# Patient Record
Sex: Male | Born: 1995 | Race: White | Hispanic: No | Marital: Single | Smoking: Never smoker
Health system: Southern US, Community
[De-identification: ages and names within clinical notes are randomized; demographics above are authoritative.]

## PROBLEM LIST (undated history)

## (undated) DIAGNOSIS — S83511A Sprain of anterior cruciate ligament of right knee, initial encounter: Secondary | ICD-10-CM

## (undated) HISTORY — PX: WISDOM TOOTH EXTRACTION: SHX21

---
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
## 2014-03-06 IMAGING — CR DG CHEST 2V
2 series · 2 of 2 positions shown · non-contrast
Comparison: None.

CLINICAL DATA: Chest pain; cough and fever

CHEST - 2 VIEW

[w chest lat]
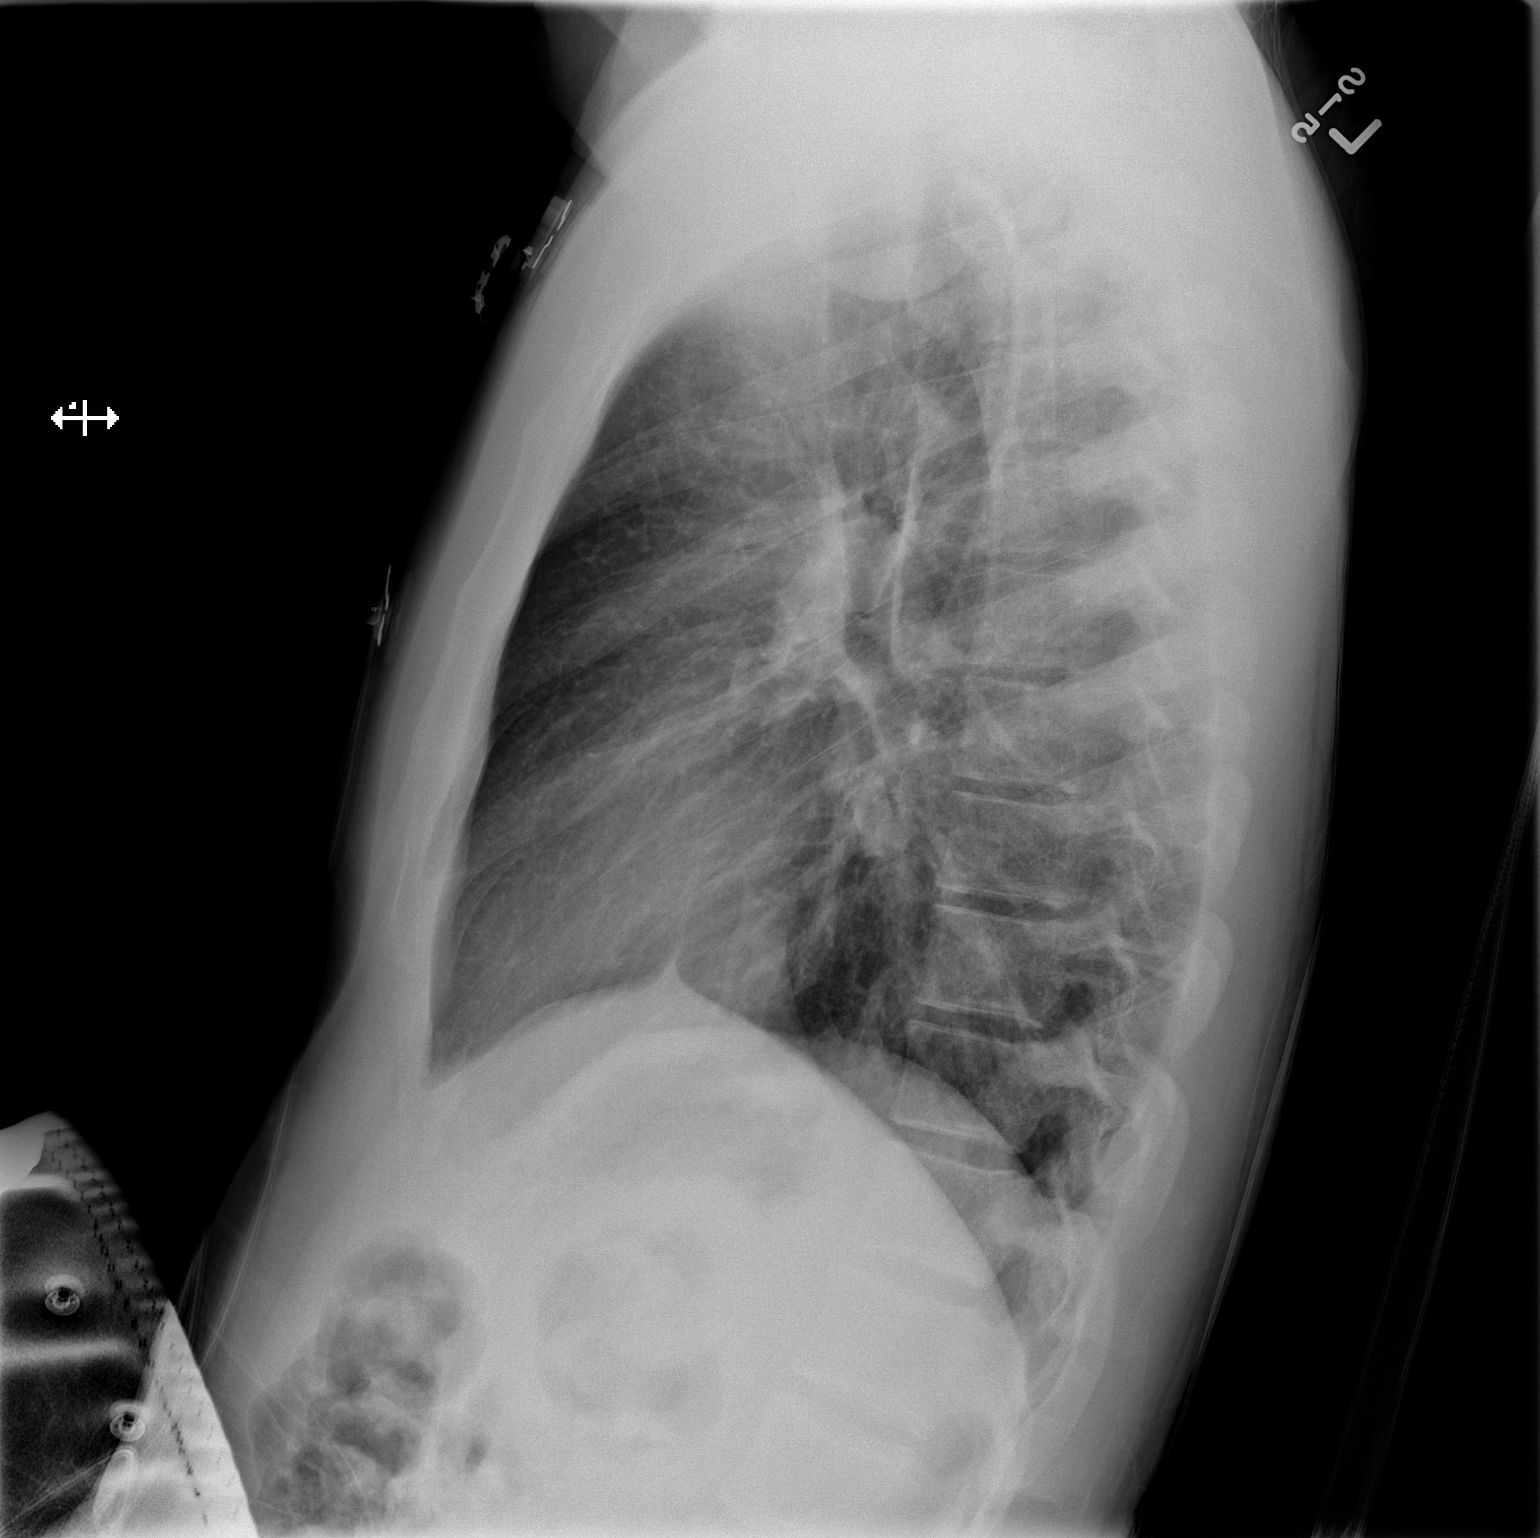

[w chest pa]
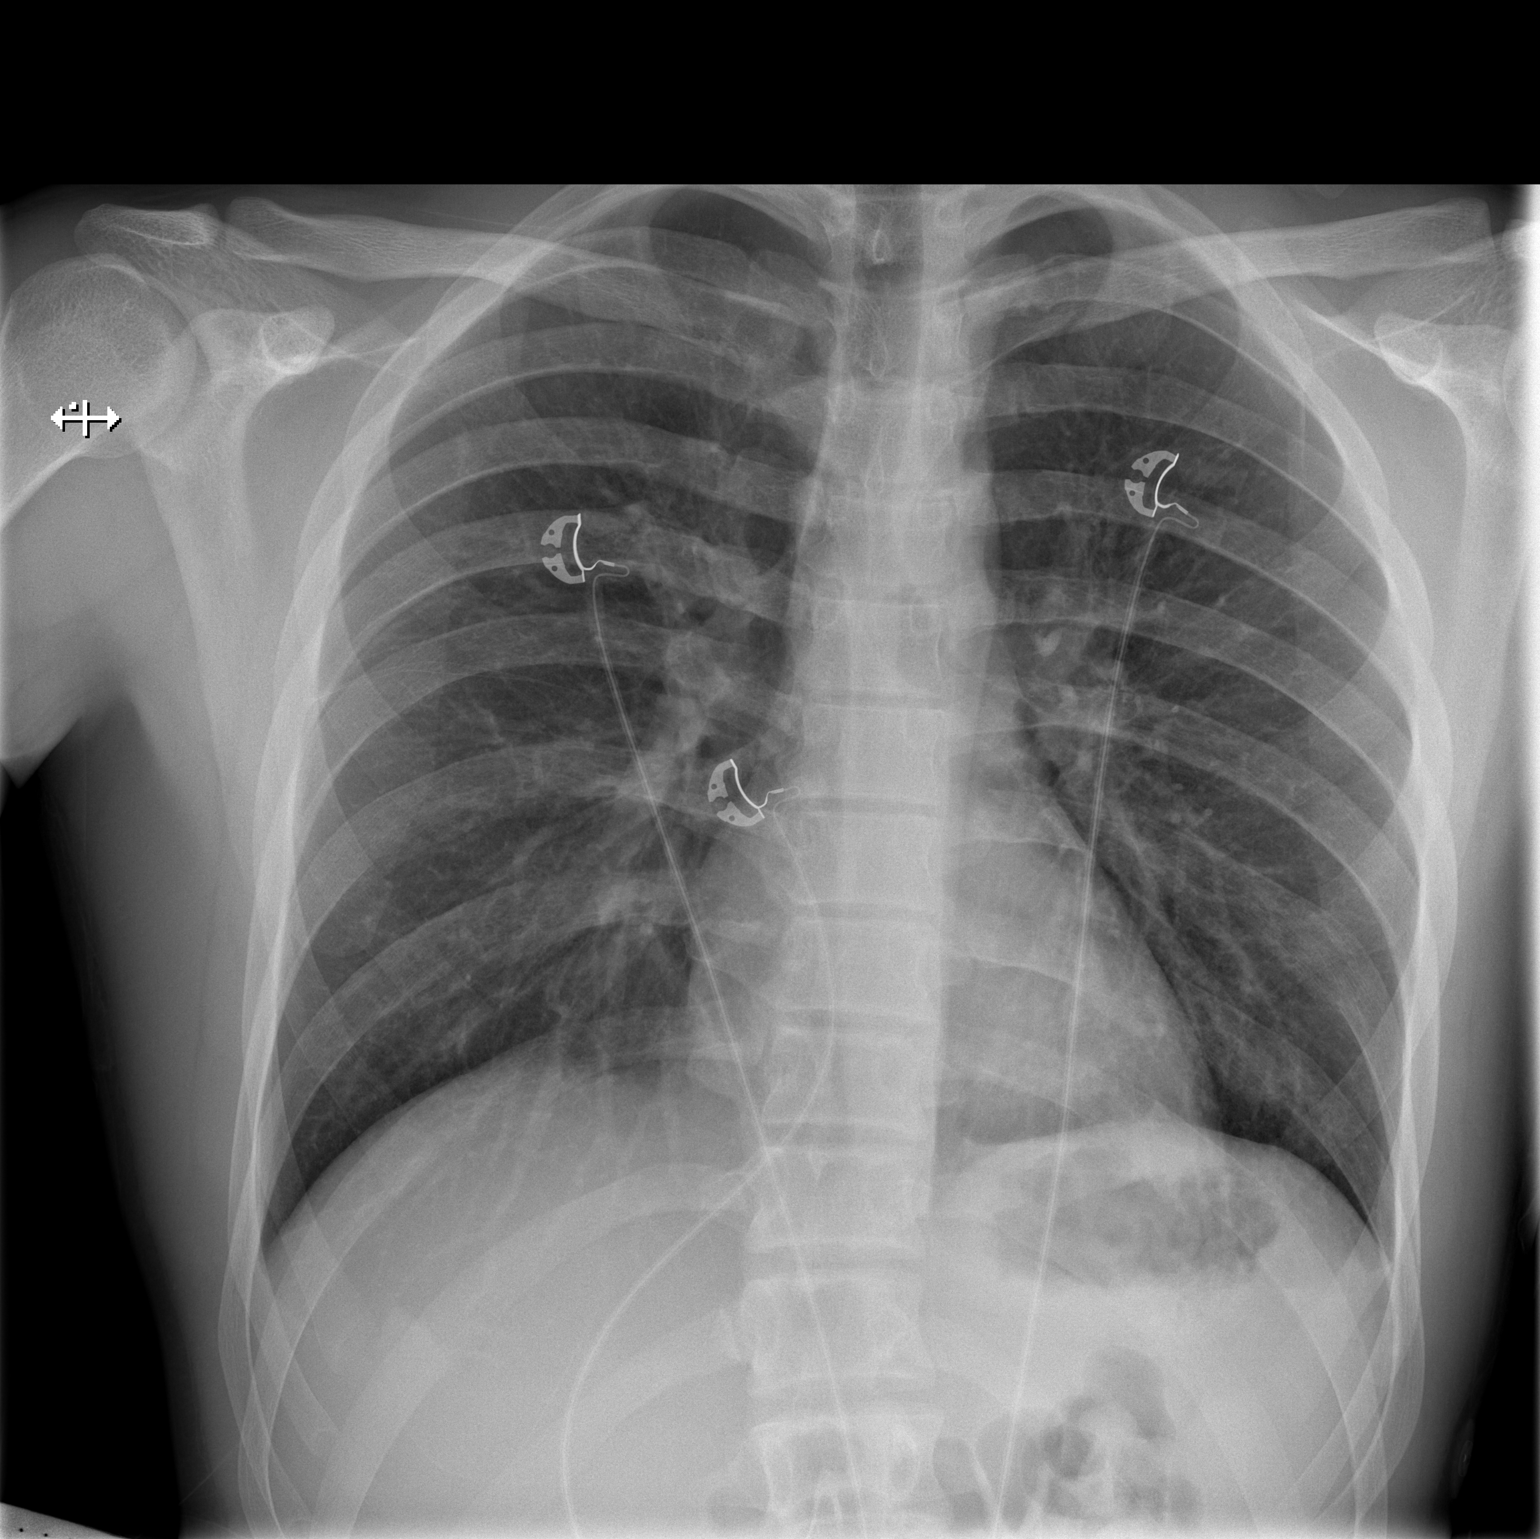

[2 of 2 positions shown; findings below may reference images not displayed]

FINDINGS: Lungs clear.  Heart size and pulmonary vascularity are
normal.  No adenopathy.  No pneumothorax.  No bone lesions.
IMPRESSION: No abnormality noted.

## 2014-03-06 IMAGING — CT CT HEAD W/O CM
1 of 2 series · 13 of 30 positions shown, 17 images · non-contrast
Comparison: None

CLINICAL DATA: Loss of consciousness.  Emesis and fever.

CT HEAD WITHOUT CONTRAST
TECHNIQUE: Contiguous axial images were obtained from the base of
the skull through the vertex without contrast.

[Series 2: peds brain wo · axial · 0.51mm/px · z∈[+147,+274]mm · 13 of 61 slices shown, 17 images]
[im 5/61  brain]
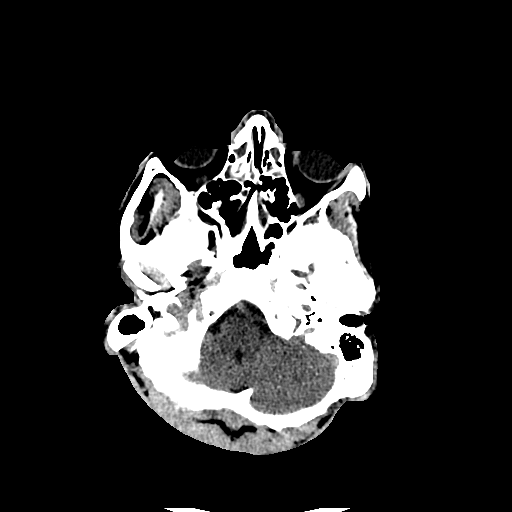
[im 5/61  bone]
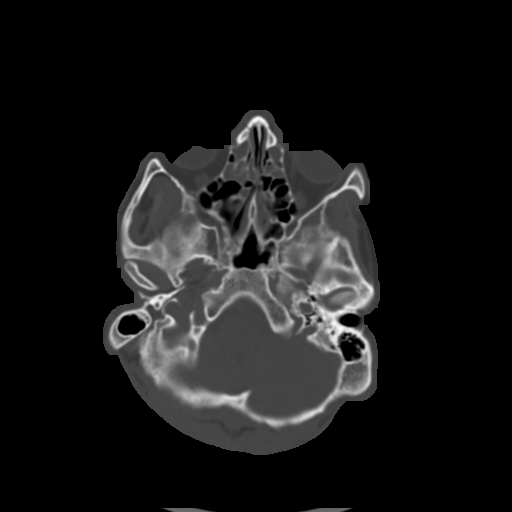
[im 9/61  brain]
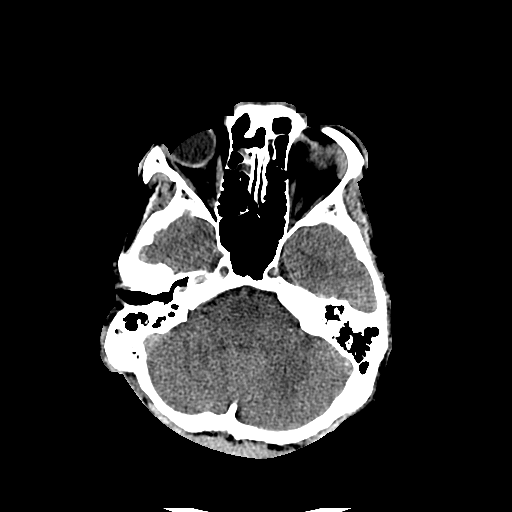
[im 13/61  brain]
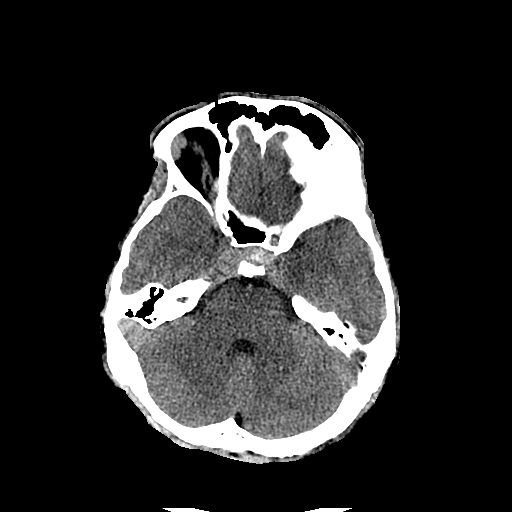
[im 18/61  brain]
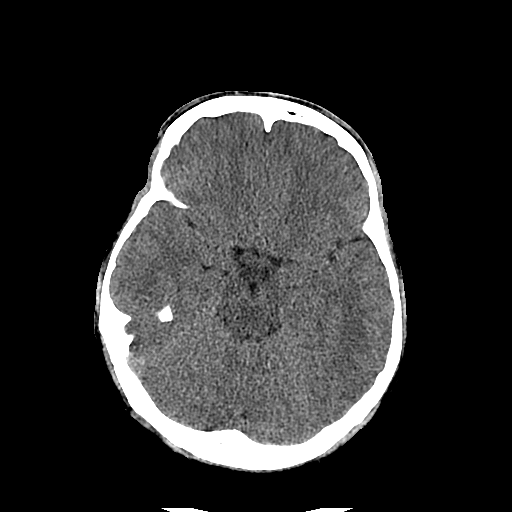
[im 22/61  brain]
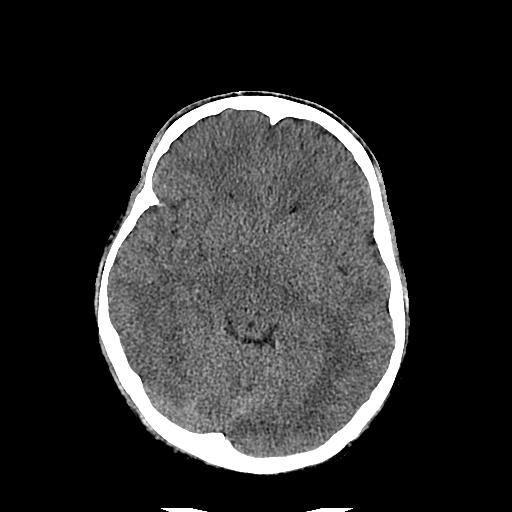
[im 22/61  bone]
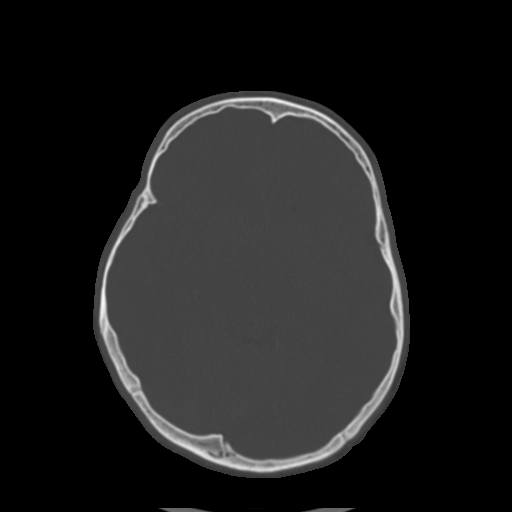
[im 26/61  brain]
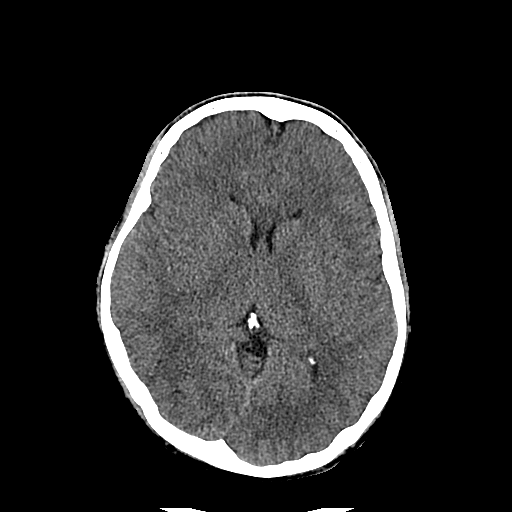
[im 31/61  brain]
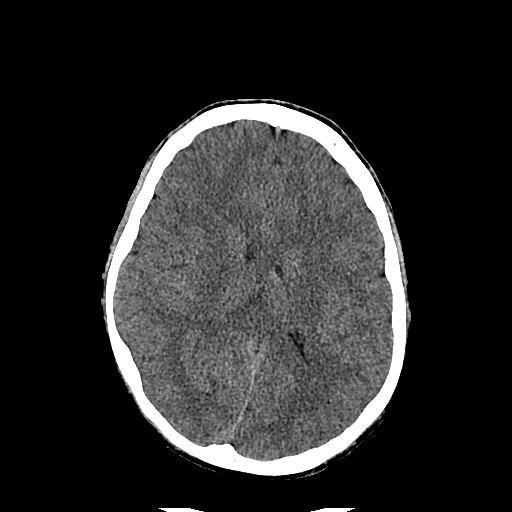
[im 35/61  brain]
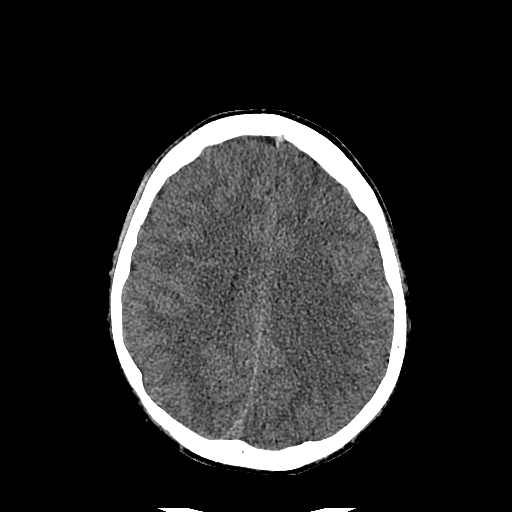
[im 39/61  brain]
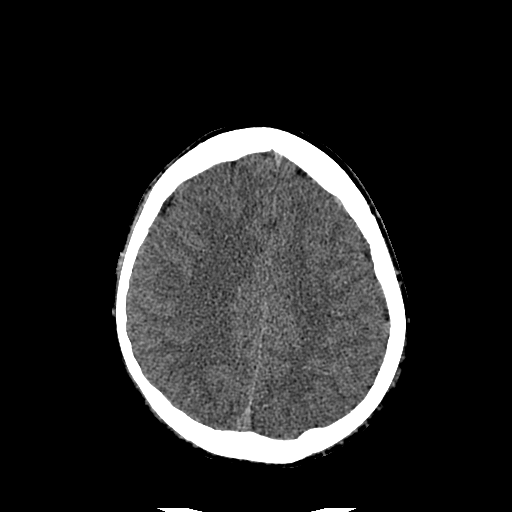
[im 39/61  bone]
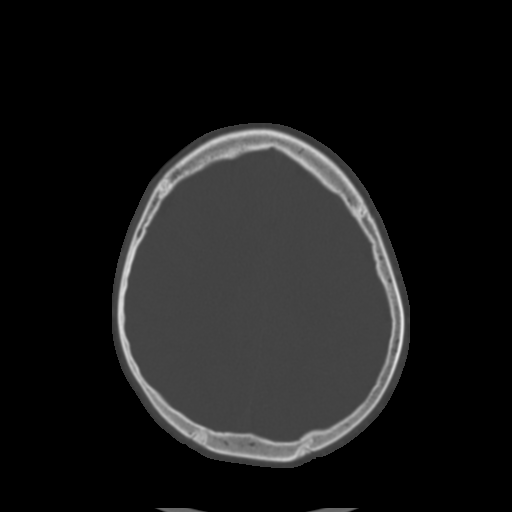
[im 43/61  brain]
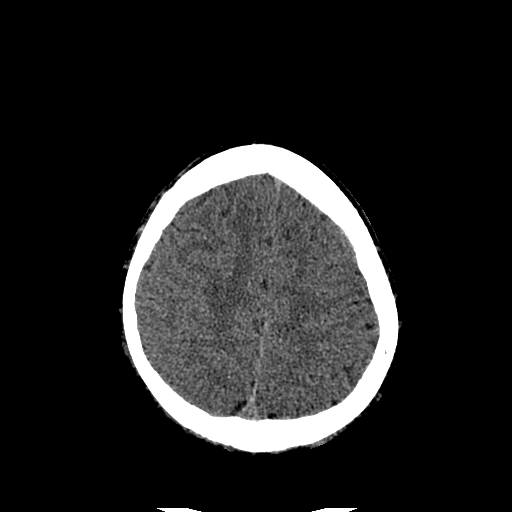
[im 48/61  brain]
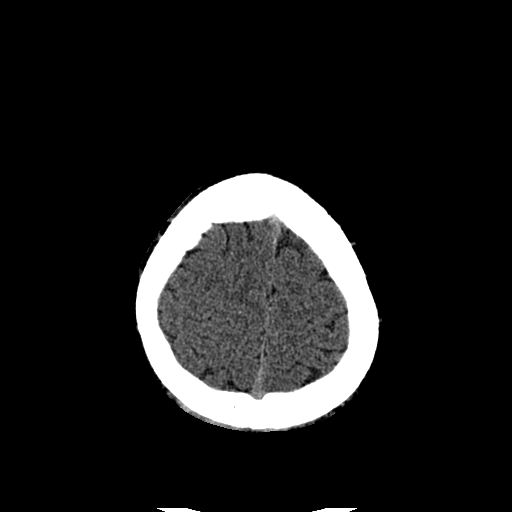
[im 52/61  brain]
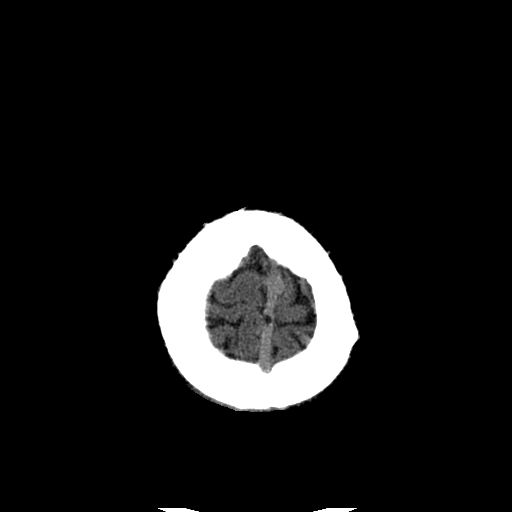
[im 56/61  brain]
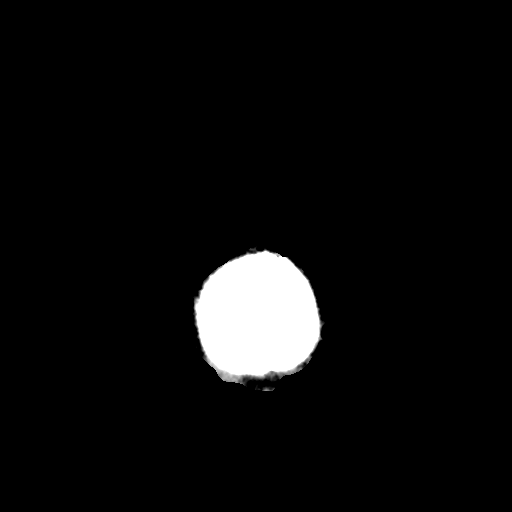
[im 56/61  bone]
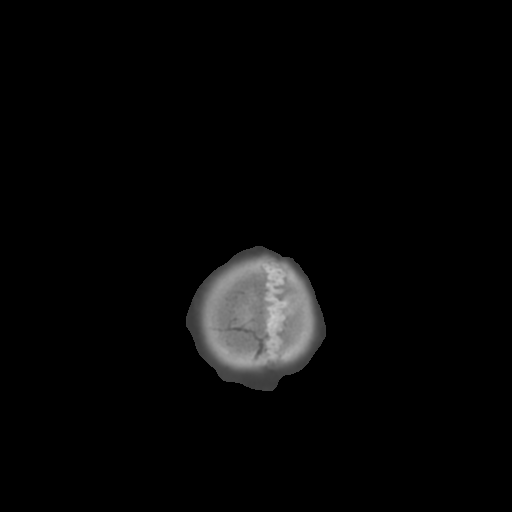

[13 of 30 positions shown; findings below may reference images not displayed]

FINDINGS: The brain has a normal appearance without evidence for
hemorrhage, infarction, hydrocephalus, or mass lesion.  There is no
extra axial fluid collection.  There is a fluid level within the
left maxillary sinus.  Partial opacification of the ethmoid air
cells noted.  The mastoid air cells are clear.  The skull is
intact.
IMPRESSION: 1.  No acute intracranial abnormalities.
2.  Sinus opacification.

## 2016-07-16 DIAGNOSIS — S83511A Sprain of anterior cruciate ligament of right knee, initial encounter: Secondary | ICD-10-CM

## 2016-07-16 HISTORY — DX: Sprain of anterior cruciate ligament of right knee, initial encounter: S83.511A

## 2016-07-27 ENCOUNTER — Encounter (HOSPITAL_BASED_OUTPATIENT_CLINIC_OR_DEPARTMENT_OTHER): Payer: Self-pay | Admitting: *Deleted

## 2016-07-29 ENCOUNTER — Ambulatory Visit: Payer: Self-pay | Admitting: Physician Assistant

## 2016-07-29 NOTE — H&P (Signed)
Riley Gaines is an 21 y.o. male.   Chief Complaint: right knee pain HPI: Patient is a 21 year-old male who is very active.  He is a IT sales professionalfirefighter.  He states he was wakeboarding about two weeks ago.  It was his first pass of the day when he came down into the water and the knee caught or twisted and he had immediate pain.  Unable to continue with the watersports.  He said he felt a pop and had a significant effusion and swelling following this.  Difficulty walking on it for a couple of days.  The pain did start to let up some.  He returned to work and was doing some training exercises last Friday, stepping out of a window and again felt a pop with a lateral movement from the knee.  Here for orthopedic evaluation. MRI confirms ACL tear.  Past Medical History:  Diagnosis Date  . Right ACL tear 07/2016    Past Surgical History:  Procedure Laterality Date  . WISDOM TOOTH EXTRACTION      No family history on file. Social History:  reports that he has never smoked. He has never used smokeless tobacco. He reports that he drinks alcohol. He reports that he does not use drugs.  Allergies: No Known Allergies   (Not in a hospital admission)  No results found for this or any previous visit (from the past 48 hour(s)). No results found.  Review of Systems  Musculoskeletal: Positive for falls and joint pain.  All other systems reviewed and are negative.   There were no vitals taken for this visit. Physical Exam  Constitutional: He is oriented to person, place, and time. He appears well-developed and well-nourished. No distress.  HENT:  Head: Normocephalic and atraumatic.  Eyes: Conjunctivae and EOM are normal. Pupils are equal, round, and reactive to light.  Neck: Normal range of motion. Neck supple.  Cardiovascular: Normal rate and intact distal pulses.   Respiratory: Effort normal. No respiratory distress.  GI: Soft. He exhibits no distension. There is no tenderness.  Musculoskeletal:   Right knee: He exhibits decreased range of motion, swelling and effusion.  Positive lachman   Neurological: He is alert and oriented to person, place, and time.  Skin: Skin is warm and dry. No erythema.  Psychiatric: He has a normal mood and affect. His behavior is normal.     Assessment/Plan Right knee ACL tear  MRI confirms isolated ACL tear without other internal derangement.  He had this happen with initial trauma at the beach.  He did have a second event at work, but based on the history I think it is highly likely that he tore his ACL with the initial event.  Risks and benefits of ACL reconstruction are discussed with the patient.  Proceed with potential scheduling as soon as practical based on wanting to get it done before I leave for vacation.  Alternatively, he is going on vacation in July.  He could be a light duty this point forward with office work only.  I told him it would not affect his results to wait until after his vacation if he wanted to.  Obviously if he went to the beach several weeks after surgery he would not be able to get in the water, etc.  I think that part is more of a social decision.  He is given a prescription for Percocet and Robaxin.  He would need post-op PT here as well and post-op visit depending on when he  decides to do it.  I look forward to potentially scheduling it in the foreseeable future.    Margart Sickles, PA-C 07/29/2016, 12:33 PM

## 2016-07-30 ENCOUNTER — Ambulatory Visit (HOSPITAL_BASED_OUTPATIENT_CLINIC_OR_DEPARTMENT_OTHER): Payer: Worker's Compensation | Admitting: Anesthesiology

## 2016-07-30 ENCOUNTER — Ambulatory Visit (HOSPITAL_BASED_OUTPATIENT_CLINIC_OR_DEPARTMENT_OTHER)
Admission: RE | Admit: 2016-07-30 | Discharge: 2016-07-30 | Disposition: A | Discharge status: Home / Self Care | Payer: Worker's Compensation | Source: Ambulatory Visit | Attending: Orthopedic Surgery | Admitting: Orthopedic Surgery

## 2016-07-30 ENCOUNTER — Encounter (HOSPITAL_BASED_OUTPATIENT_CLINIC_OR_DEPARTMENT_OTHER)
Admission: RE | Disposition: A | Discharge status: Home / Self Care | Payer: Self-pay | Source: Ambulatory Visit | Attending: Orthopedic Surgery

## 2016-07-30 ENCOUNTER — Encounter (HOSPITAL_BASED_OUTPATIENT_CLINIC_OR_DEPARTMENT_OTHER): Payer: Self-pay | Admitting: *Deleted

## 2016-07-30 DIAGNOSIS — Y92828 Other wilderness area as the place of occurrence of the external cause: Secondary | ICD-10-CM | POA: Insufficient documentation

## 2016-07-30 DIAGNOSIS — S83511A Sprain of anterior cruciate ligament of right knee, initial encounter: Secondary | ICD-10-CM | POA: Insufficient documentation

## 2016-07-30 DIAGNOSIS — Y9317 Activity, water skiing and wake boarding: Secondary | ICD-10-CM | POA: Insufficient documentation

## 2016-07-30 HISTORY — DX: Sprain of anterior cruciate ligament of right knee, initial encounter: S83.511A

## 2016-07-30 HISTORY — PX: KNEE ARTHROSCOPY WITH ANTERIOR CRUCIATE LIGAMENT (ACL) REPAIR: SHX5644

## 2016-07-30 SURGERY — KNEE ARTHROSCOPY WITH ANTERIOR CRUCIATE LIGAMENT (ACL) REPAIR
Anesthesia: General | Site: Knee | Laterality: Right

## 2016-07-30 MED ORDER — FENTANYL CITRATE (PF) 100 MCG/2ML IJ SOLN
INTRAMUSCULAR | Status: AC
  Filled 2016-07-30: qty 2

## 2016-07-30 MED ORDER — MIDAZOLAM HCL 2 MG/2ML IJ SOLN
INTRAMUSCULAR | Status: AC
  Filled 2016-07-30: qty 2

## 2016-07-30 MED ORDER — PROMETHAZINE HCL 25 MG/ML IJ SOLN: 6.2500 mg | INTRAMUSCULAR | Status: DC | PRN

## 2016-07-30 MED ORDER — MORPHINE SULFATE 10 MG/ML IJ SOLN
INTRAMUSCULAR | Status: DC | PRN
  Administered 2016-07-30 (×3): 2 mg via INTRAVENOUS

## 2016-07-30 MED ORDER — MEPERIDINE HCL 25 MG/ML IJ SOLN: 6.2500 mg | INTRAMUSCULAR | Status: DC | PRN

## 2016-07-30 MED ORDER — DEXAMETHASONE SODIUM PHOSPHATE 10 MG/ML IJ SOLN
INTRAMUSCULAR | Status: AC
  Filled 2016-07-30: qty 1

## 2016-07-30 MED ORDER — PROPOFOL 500 MG/50ML IV EMUL
INTRAVENOUS | Status: AC
  Filled 2016-07-30: qty 50

## 2016-07-30 MED ORDER — LIDOCAINE 2% (20 MG/ML) 5 ML SYRINGE
INTRAMUSCULAR | Status: DC | PRN
  Administered 2016-07-30: 40 mg via INTRAVENOUS

## 2016-07-30 MED ORDER — CHLORHEXIDINE GLUCONATE 4 % EX LIQD: 60.0000 mL | Freq: Once | CUTANEOUS | Status: DC

## 2016-07-30 MED ORDER — DEXAMETHASONE SODIUM PHOSPHATE 4 MG/ML IJ SOLN
INTRAMUSCULAR | Status: DC | PRN
  Administered 2016-07-30: 10 mg via INTRAVENOUS

## 2016-07-30 MED ORDER — CEFAZOLIN SODIUM-DEXTROSE 2-4 GM/100ML-% IV SOLN
2.0000 g | INTRAVENOUS | Status: AC
  Administered 2016-07-30: 2 g via INTRAVENOUS

## 2016-07-30 MED ORDER — SODIUM CHLORIDE 0.9 % IR SOLN
Status: DC | PRN
  Administered 2016-07-30: 15000 mL

## 2016-07-30 MED ORDER — FENTANYL CITRATE (PF) 100 MCG/2ML IJ SOLN
25.0000 ug | INTRAMUSCULAR | Status: DC | PRN
  Administered 2016-07-30: 50 ug via INTRAVENOUS

## 2016-07-30 MED ORDER — MIDAZOLAM HCL 2 MG/2ML IJ SOLN
1.0000 mg | INTRAMUSCULAR | Status: DC | PRN
  Administered 2016-07-30: 2 mg via INTRAVENOUS

## 2016-07-30 MED ORDER — OXYCODONE HCL 5 MG PO TABS
5.0000 mg | ORAL_TABLET | Freq: Once | ORAL | Status: AC | PRN
  Administered 2016-07-30: 5 mg via ORAL

## 2016-07-30 MED ORDER — LIDOCAINE 2% (20 MG/ML) 5 ML SYRINGE
INTRAMUSCULAR | Status: AC
  Filled 2016-07-30: qty 5

## 2016-07-30 MED ORDER — SODIUM CHLORIDE 0.9 % IV SOLN: INTRAVENOUS | Status: DC

## 2016-07-30 MED ORDER — BUPIVACAINE-EPINEPHRINE (PF) 0.5% -1:200000 IJ SOLN
INTRAMUSCULAR | Status: DC | PRN
  Administered 2016-07-30: 30 mL via PERINEURAL

## 2016-07-30 MED ORDER — LACTATED RINGERS IV SOLN
INTRAVENOUS | Status: DC
  Administered 2016-07-30: 09:00:00 via INTRAVENOUS

## 2016-07-30 MED ORDER — HYDROCODONE-ACETAMINOPHEN 7.5-325 MG PO TABS: 1.0000 | ORAL_TABLET | Freq: Once | ORAL | Status: DC | PRN

## 2016-07-30 MED ORDER — SCOPOLAMINE 1 MG/3DAYS TD PT72: 1.0000 | MEDICATED_PATCH | Freq: Once | TRANSDERMAL | Status: DC | PRN

## 2016-07-30 MED ORDER — CEFAZOLIN SODIUM-DEXTROSE 2-4 GM/100ML-% IV SOLN
INTRAVENOUS | Status: AC
  Filled 2016-07-30: qty 100

## 2016-07-30 MED ORDER — PROPOFOL 10 MG/ML IV BOLUS
INTRAVENOUS | Status: DC | PRN
  Administered 2016-07-30: 250 mg via INTRAVENOUS

## 2016-07-30 MED ORDER — MORPHINE SULFATE (PF) 10 MG/ML IV SOLN
INTRAVENOUS | Status: AC
  Filled 2016-07-30: qty 1

## 2016-07-30 MED ORDER — FENTANYL CITRATE (PF) 100 MCG/2ML IJ SOLN
50.0000 ug | INTRAMUSCULAR | Status: DC | PRN
  Administered 2016-07-30: 50 ug via INTRAVENOUS

## 2016-07-30 MED ORDER — OXYCODONE HCL 5 MG PO TABS
ORAL_TABLET | ORAL | Status: AC
  Filled 2016-07-30: qty 1

## 2016-07-30 MED ORDER — ONDANSETRON HCL 4 MG/2ML IJ SOLN
INTRAMUSCULAR | Status: AC
  Filled 2016-07-30: qty 2

## 2016-07-30 MED ORDER — SODIUM CHLORIDE 0.9 % IJ SOLN
INTRAMUSCULAR | Status: AC
  Filled 2016-07-30: qty 10

## 2016-07-30 SURGICAL SUPPLY — 83 items
APL SKNCLS STERI-STRIP NONHPOA (GAUZE/BANDAGES/DRESSINGS) ×1
BANDAGE ACE 4X5 VEL STRL LF (GAUZE/BANDAGES/DRESSINGS) ×3 IMPLANT
BANDAGE ACE 6X5 VEL STRL LF (GAUZE/BANDAGES/DRESSINGS) ×3 IMPLANT
BENZOIN TINCTURE PRP APPL 2/3 (GAUZE/BANDAGES/DRESSINGS) ×3 IMPLANT
BLADE 4.2CUDA (BLADE) ×3 IMPLANT
BLADE AVERAGE 25MMX9MM (BLADE)
BLADE AVERAGE 25X9 (BLADE) IMPLANT
BLADE CUDA 5.5 (BLADE) IMPLANT
BLADE CUDA GRT WHITE 3.5 (BLADE) IMPLANT
BLADE CUDA SHAVER 3.5 (BLADE) IMPLANT
BLADE CUTTER MENIS 5.5 (BLADE) IMPLANT
BLADE GREAT WHITE 4.2 (BLADE) IMPLANT
BLADE GREAT WHITE 4.2MM (BLADE)
BLADE OSCIL/SAGITTAL W/10 ST (BLADE) ×1 IMPLANT
BLADE OSCIL/SAGITTAL W/10MM ST (BLADE) ×1
BLADE SURG 10 STRL SS (BLADE) ×3 IMPLANT
BLADE SURG 15 STRL LF DISP TIS (BLADE) ×1 IMPLANT
BLADE SURG 15 STRL SS (BLADE) ×3
BUR VERTEX HOODED 4.5 (BURR) ×3 IMPLANT
CLOSURE WOUND 1/2 X4 (GAUZE/BANDAGES/DRESSINGS) ×2
COVER BACK TABLE 60X90IN (DRAPES) ×3 IMPLANT
CUTTER MENISCUS  4.2MM (BLADE)
CUTTER MENISCUS 4.2MM (BLADE) IMPLANT
DRAPE ARTHROSCOPY W/POUCH 114 (DRAPES) ×3 IMPLANT
DRAPE IMP U-DRAPE 54X76 (DRAPES) ×3 IMPLANT
DRAPE INCISE IOBAN 66X45 STRL (DRAPES) ×3 IMPLANT
DRSG EMULSION OIL 3X3 NADH (GAUZE/BANDAGES/DRESSINGS) ×2 IMPLANT
DURAPREP 26ML APPLICATOR (WOUND CARE) ×3 IMPLANT
ELECT REM PT RETURN 9FT ADLT (ELECTROSURGICAL)
ELECTRODE REM PT RTRN 9FT ADLT (ELECTROSURGICAL) IMPLANT
GLOVE BIO SURGEON STRL SZ7.5 (GLOVE) ×6 IMPLANT
GLOVE BIOGEL PI IND STRL 7.0 (GLOVE) IMPLANT
GLOVE BIOGEL PI IND STRL 8 (GLOVE) ×3 IMPLANT
GLOVE BIOGEL PI INDICATOR 7.0 (GLOVE) ×4
GLOVE BIOGEL PI INDICATOR 8 (GLOVE) ×8
GLOVE ECLIPSE 6.5 STRL STRAW (GLOVE) ×2 IMPLANT
GLOVE SURG ORTHO 8.0 STRL STRW (GLOVE) ×3 IMPLANT
GLOVE SURG SS PI 8.0 STRL IVOR (GLOVE) ×2 IMPLANT
GOWN STRL REUS W/ TWL LRG LVL3 (GOWN DISPOSABLE) ×1 IMPLANT
GOWN STRL REUS W/ TWL XL LVL3 (GOWN DISPOSABLE) ×1 IMPLANT
GOWN STRL REUS W/TWL LRG LVL3 (GOWN DISPOSABLE) ×3
GOWN STRL REUS W/TWL XL LVL3 (GOWN DISPOSABLE) ×9 IMPLANT
IMMOBILIZER KNEE 22 UNIV (SOFTGOODS) IMPLANT
IMMOBILIZER KNEE 24 THIGH 36 (MISCELLANEOUS) IMPLANT
IMMOBILIZER KNEE 24 UNIV (MISCELLANEOUS) ×3
IV NS IRRIG 3000ML ARTHROMATIC (IV SOLUTION) ×10 IMPLANT
KIT TRANSTIBIAL (DISPOSABLE) ×2 IMPLANT
KNEE WRAP E Z 3 GEL PACK (MISCELLANEOUS) ×3 IMPLANT
KNIFE GRAFT ACL 10MM 5952 (MISCELLANEOUS) ×2 IMPLANT
MANIFOLD NEPTUNE II (INSTRUMENTS) ×3 IMPLANT
NDL SAFETY ECLIPSE 18X1.5 (NEEDLE) ×1 IMPLANT
NEEDLE HYPO 18GX1.5 SHARP (NEEDLE) ×3
PACK ARTHROSCOPY DSU (CUSTOM PROCEDURE TRAY) ×3 IMPLANT
PACK BASIN DAY SURGERY FS (CUSTOM PROCEDURE TRAY) ×3 IMPLANT
PASSER SUT SWANSON 36MM LOOP (INSTRUMENTS) IMPLANT
PENCIL BUTTON HOLSTER BLD 10FT (ELECTRODE) ×2 IMPLANT
PROBE BIPOLAR ARTHRO 85MM 30D (MISCELLANEOUS) IMPLANT
PROBE BIPOLAR ATHRO 135MM 90D (MISCELLANEOUS) ×2 IMPLANT
SCREW INTERFERENCE 8X20MM (Screw) ×2 IMPLANT
SCREW INTERFERENCE 8X25MM (Screw) ×2 IMPLANT
SCREW SHEATHED INTERF 7X20 (Screw) ×2 IMPLANT
SET ARTHROSCOPY TUBING (MISCELLANEOUS) ×3
SET ARTHROSCOPY TUBING LN (MISCELLANEOUS) ×1 IMPLANT
SPONGE LAP 4X18 X RAY DECT (DISPOSABLE) ×5 IMPLANT
STRIP CLOSURE SKIN 1/2X4 (GAUZE/BANDAGES/DRESSINGS) ×3 IMPLANT
SUCTION FRAZIER HANDLE 10FR (MISCELLANEOUS) ×2
SUCTION TUBE FRAZIER 10FR DISP (MISCELLANEOUS) ×1 IMPLANT
SUT 2 FIBERLOOP 20 STRT BLUE (SUTURE)
SUT FIBERWIRE #2 38 T-5 BLUE (SUTURE) ×12
SUT MNCRL AB 3-0 PS2 18 (SUTURE) ×3 IMPLANT
SUT VIC AB 0 CT1 27 (SUTURE) ×3
SUT VIC AB 0 CT1 27XBRD ANBCTR (SUTURE) IMPLANT
SUT VIC AB 2-0 CT1 27 (SUTURE) ×6
SUT VIC AB 2-0 CT1 TAPERPNT 27 (SUTURE) ×1 IMPLANT
SUT VIC AB 3-0 SH 27 (SUTURE)
SUT VIC AB 3-0 SH 27X BRD (SUTURE) IMPLANT
SUTURE 2 FIBERLOOP 20 STRT BLU (SUTURE) IMPLANT
SUTURE FIBERWR #2 38 T-5 BLUE (SUTURE) IMPLANT
SYR 5ML LL (SYRINGE) ×3 IMPLANT
TOWEL OR 17X24 6PK STRL BLUE (TOWEL DISPOSABLE) ×8 IMPLANT
TOWEL OR NON WOVEN STRL DISP B (DISPOSABLE) ×3 IMPLANT
WATER STERILE IRR 1000ML POUR (IV SOLUTION) ×3 IMPLANT
YANKAUER SUCT BULB TIP NO VENT (SUCTIONS) ×3 IMPLANT

## 2016-07-30 NOTE — Anesthesia Preprocedure Evaluation (Addendum)
Anesthesia Evaluation  Patient identified by MRN, date of birth, ID band Patient awake    Reviewed: Allergy & Precautions, NPO status , Patient's Chart, lab work & pertinent test results  Airway Mallampati: I  TM Distance: >3 FB Neck ROM: Full    Dental no notable dental hx. (+) Teeth Intact   Pulmonary neg pulmonary ROS,    Pulmonary exam normal breath sounds clear to auscultation       Cardiovascular negative cardio ROS Normal cardiovascular exam Rhythm:Regular Rate:Normal     Neuro/Psych negative neurological ROS  negative psych ROS   GI/Hepatic negative GI ROS, Neg liver ROS,   Endo/Other  negative endocrine ROS  Renal/GU negative Renal ROS  negative genitourinary   Musculoskeletal Torn ACL right knee   Abdominal   Peds  Hematology negative hematology ROS (+)   Anesthesia Other Findings   Reproductive/Obstetrics                            Anesthesia Physical Anesthesia Plan  ASA: I  Anesthesia Plan: General   Post-op Pain Management:  Regional for Post-op pain   Induction: Intravenous  PONV Risk Score and Plan: 3 and Ondansetron, Dexamethasone, Propofol and Midazolam  Airway Management Planned: LMA  Additional Equipment:   Intra-op Plan:   Post-operative Plan: Extubation in OR  Informed Consent: I have reviewed the patients History and Physical, chart, labs and discussed the procedure including the risks, benefits and alternatives for the proposed anesthesia with the patient or authorized representative who has indicated his/her understanding and acceptance.   Dental advisory given  Plan Discussed with: CRNA, Surgeon and Anesthesiologist  Anesthesia Plan Comments:         Anesthesia Quick Evaluation

## 2016-07-30 NOTE — Discharge Instructions (Signed)
Diet: As you were doing prior to hospitalization   Activity: Increase activity slowly as tolerated  No lifting or driving for 6 weeks   Shower: May shower without a dressing on post op day #3, NO SOAKING in tub   Dressing: You may change your dressing on post op day #3.  Then change the dressing daily with sterile 4"x4"s gauze dressing   Weight Bearing: weight bearing as tolerated in knee immobilizer only.   To prevent constipation: you may use a stool softener such as -  Colace ( over the counter) 100 mg by mouth twice a day  Drink plenty of fluids ( prune juice may be helpful) and high fiber foods  Miralax ( over the counter) for constipation as needed.   Precautions: If you experience chest pain or shortness of breath - call 911 immediately For transfer to the hospital emergency department!!  If you develop a fever greater that 101 F, purulent drainage from wound, increased redness or drainage from wound, or calf pain -- Call the office   Follow- Up Appointment: Please call for an appointment to be seen in 1 week or as previously scheduled  [facility] - [phone]   Post Anesthesia Home Care Instructions  Activity: Get plenty of rest for the remainder of the day. A responsible individual must stay with you for 24 hours following the procedure.  For the next 24 hours, DO NOT: -Drive a car -Advertising copywriter -Drink alcoholic beverages -Take any medication unless instructed by your physician -Make any legal decisions or sign important papers.  Meals: Start with liquid foods such as gelatin or soup. Progress to regular foods as tolerated. Avoid greasy, spicy, heavy foods. If nausea and/or vomiting occur, drink only clear liquids until the nausea and/or vomiting subsides. Call your physician if vomiting continues.  Special Instructions/Symptoms: Your throat may feel dry or sore from the anesthesia or the breathing tube placed in your throat during surgery. If this causes  discomfort, gargle with warm salt water. The discomfort should disappear within 24 hours.  If you had a scopolamine patch placed behind your ear for the management of post- operative nausea and/or vomiting:  1. The medication in the patch is effective for 72 hours, after which it should be removed.  Wrap patch in a tissue and discard in the trash. Wash hands thoroughly with soap and water. 2. You may remove the patch earlier than 72 hours if you experience unpleasant side effects which may include dry mouth, dizziness or visual disturbances. 3. Avoid touching the patch. Wash your hands with soap and water after contact with the patch.   Regional Anesthesia Blocks  1. Numbness or the inability to move the "blocked" extremity may last from 3-48 hours after placement. The length of time depends on the medication injected and your individual response to the medication. If the numbness is not going away after 48 hours, call your surgeon.  2. The extremity that is blocked will need to be protected until the numbness is gone and the  Strength has returned. Because you cannot feel it, you will need to take extra care to avoid injury. Because it may be weak, you may have difficulty moving it or using it. You may not know what position it is in without looking at it while the block is in effect.  3. For blocks in the legs and feet, returning to weight bearing and walking needs to be done carefully. You will need to wait until the numbness is  entirely gone and the strength has returned. You should be able to move your leg and foot normally before you try and bear weight or walk. You will need someone to be with you when you first try to ensure you do not fall and possibly risk injury.  4. Bruising and tenderness at the needle site are common side effects and will resolve in a few days.  5. Persistent numbness or new problems with movement should be communicated to the surgeon or the [facility]  [phone])/ [facility] [phone]).

## 2016-07-30 NOTE — Transfer of Care (Signed)
Immediate Anesthesia Transfer of Care Note  Patient: Riley Gaines  Procedure(s) Performed: Procedure(s): RIGHT KNEE ANTERIOR CRUCIATE LIGAMENT (ACL) REPAIR (Right)  Patient Location: PACU  Anesthesia Type:General  Level of Consciousness: awake and sedated  Airway & Oxygen Therapy: Patient Spontanous Breathing and Patient connected to face mask oxygen  Post-op Assessment: Report given to RN and Post -op Vital signs reviewed and stable  Post vital signs: Reviewed and stable  Last Vitals:  Vitals:   07/30/16 0910 07/30/16 0915  BP: 126/70   Pulse: (!) 54 77  Resp: 13 15  Temp:      Last Pain:  Vitals:   07/30/16 0845  TempSrc: Oral  PainSc:          Complications: No apparent anesthesia complications

## 2016-07-30 NOTE — Brief Op Note (Signed)
07/30/2016  12:09 PM  PATIENT:  Riley Gaines  21 y.o. male  PRE-OPERATIVE DIAGNOSIS:  right knee ACL tear  POST-OPERATIVE DIAGNOSIS:  right knee ACL tear  PROCEDURE:  Procedure(s): RIGHT KNEE ANTERIOR CRUCIATE LIGAMENT (ACL) REPAIR (Right)  SURGEON:  Surgeon(s) and Role:    Frederico Hamman* Caffrey, Daniel, MD - Primary  PHYSICIAN ASSISTANT: Margart SicklesJoshua Shawntrice Salle, PA-C  ASSISTANTS: OR staff x1   ANESTHESIA:   regional and general  EBL:  Total I/O In: 1600 [I.V.:1600] Out: -   BLOOD ADMINISTERED:none  DRAINS: none   LOCAL MEDICATIONS USED:  NONE  SPECIMEN:  No Specimen  DISPOSITION OF SPECIMEN:  N/A  COUNTS:  YES  TOURNIQUET:  * No tourniquets in log *  DICTATION: .Other Dictation: Dictation Number unknown  PLAN OF CARE: Discharge to home after PACU  PATIENT DISPOSITION:  PACU - hemodynamically stable.   Delay start of Pharmacological VTE agent (>24hrs) due to surgical blood loss or risk of bleeding: not applicable

## 2016-07-30 NOTE — Anesthesia Postprocedure Evaluation (Signed)
Anesthesia Post Note  Patient: Riley Gaines  Procedure(s) Performed: Procedure(s) (LRB): RIGHT KNEE ANTERIOR CRUCIATE LIGAMENT (ACL) REPAIR (Right)     Patient location during evaluation: PACU Anesthesia Type: General Level of consciousness: awake and alert and oriented Pain management: pain level controlled Vital Signs Assessment: post-procedure vital signs reviewed and stable Respiratory status: spontaneous breathing, nonlabored ventilation and respiratory function stable Cardiovascular status: blood pressure returned to baseline and stable Postop Assessment: no signs of nausea or vomiting Anesthetic complications: no    Last Vitals:  Vitals:   07/30/16 1230 07/30/16 1245  BP: (!) 134/95 (!) 146/93  Pulse: 85 69  Resp: (!) 21 17  Temp:      Last Pain:  Vitals:   07/30/16 1245  TempSrc:   PainSc: 6                  Sundae Maners A.

## 2016-07-30 NOTE — Interval H&P Note (Signed)
History and Physical Interval Note:  07/30/2016 9:26 AM  Riley Gaines  has presented today for surgery, with the diagnosis of right knee ACL tear  The various methods of treatment have been discussed with the patient and family. After consideration of risks, benefits and other options for treatment, the patient has consented to  Procedure(s): RIGHT KNEE ANTERIOR CRUCIATE LIGAMENT (ACL) REPAIR (Right) as a surgical intervention .  The patient's history has been reviewed, patient examined, no change in status, stable for surgery.  I have reviewed the patient's chart and labs.  Questions were answered to the patient's satisfaction.     Arthor Gorter JR,W D

## 2016-07-30 NOTE — Anesthesia Procedure Notes (Addendum)
Anesthesia Regional Block: Femoral nerve block   Pre-Anesthetic Checklist: ,, timeout performed, Correct Patient, Correct Site, Correct Laterality, Correct Procedure, Correct Position, site marked, Risks and benefits discussed,  Surgical consent,  Pre-op evaluation,  At surgeon's request and post-op pain management  Laterality: Right  Prep: chloraprep       Needles:  Injection technique: Single-shot  Needle Type: Echogenic Stimulator Needle     Needle Length: 9cm  Needle Gauge: 21   Needle insertion depth: 3 cm   Additional Needles:   Procedures: ultrasound guided, nerve stimulator,,,,,,  Motor weakness within 15 minutes.   Nerve Stimulator or Paresthesia:  Response: Quad twitch, 0.2 mA, 3 cm  Additional Responses:   Narrative:  Start time: 07/30/2016 9:04 AM End time: 07/30/2016 9:09 AM Injection made incrementally with aspirations every 5 mL.  Performed by: Personally  Anesthesiologist: Mal AmabileFOSTER, Daysie Helf  Additional Notes: Timeout performed. Patient sedated. Relevant anatomy ID'd using US. Twitch of quadriceps at 0.312mA. Incremental 2-375ml injection of LA with frequent aspiration. Patient tolerated procedure well.

## 2016-07-30 NOTE — H&P (View-Only) (Signed)
Riley Gaines is an 21 y.o. male.   Chief Complaint: right knee pain HPI: Patient is a 21 year-old male who is very active.  He is a IT sales professionalfirefighter.  He states he was wakeboarding about two weeks ago.  It was his first pass of the day when he came down into the water and the knee caught or twisted and he had immediate pain.  Unable to continue with the watersports.  He said he felt a pop and had a significant effusion and swelling following this.  Difficulty walking on it for a couple of days.  The pain did start to let up some.  He returned to work and was doing some training exercises last Friday, stepping out of a window and again felt a pop with a lateral movement from the knee.  Here for orthopedic evaluation. MRI confirms ACL tear.  Past Medical History:  Diagnosis Date  . Right ACL tear 07/2016    Past Surgical History:  Procedure Laterality Date  . WISDOM TOOTH EXTRACTION      No family history on file. Social History:  reports that he has never smoked. He has never used smokeless tobacco. He reports that he drinks alcohol. He reports that he does not use drugs.  Allergies: No Known Allergies   (Not in a hospital admission)  No results found for this or any previous visit (from the past 48 hour(s)). No results found.  Review of Systems  Musculoskeletal: Positive for falls and joint pain.  All other systems reviewed and are negative.   There were no vitals taken for this visit. Physical Exam  Constitutional: He is oriented to person, place, and time. He appears well-developed and well-nourished. No distress.  HENT:  Head: Normocephalic and atraumatic.  Eyes: Conjunctivae and EOM are normal. Pupils are equal, round, and reactive to light.  Neck: Normal range of motion. Neck supple.  Cardiovascular: Normal rate and intact distal pulses.   Respiratory: Effort normal. No respiratory distress.  GI: Soft. He exhibits no distension. There is no tenderness.  Musculoskeletal:   Right knee: He exhibits decreased range of motion, swelling and effusion.  Positive lachman   Neurological: He is alert and oriented to person, place, and time.  Skin: Skin is warm and dry. No erythema.  Psychiatric: He has a normal mood and affect. His behavior is normal.     Assessment/Plan Right knee ACL tear  MRI confirms isolated ACL tear without other internal derangement.  He had this happen with initial trauma at the beach.  He did have a second event at work, but based on the history I think it is highly likely that he tore his ACL with the initial event.  Risks and benefits of ACL reconstruction are discussed with the patient.  Proceed with potential scheduling as soon as practical based on wanting to get it done before I leave for vacation.  Alternatively, he is going on vacation in July.  He could be a light duty this point forward with office work only.  I told him it would not affect his results to wait until after his vacation if he wanted to.  Obviously if he went to the beach several weeks after surgery he would not be able to get in the water, etc.  I think that part is more of a social decision.  He is given a prescription for Percocet and Robaxin.  He would need post-op PT here as well and post-op visit depending on when he  decides to do it.  I look forward to potentially scheduling it in the foreseeable future.    Margart Sickles, PA-C 07/29/2016, 12:33 PM

## 2016-07-30 NOTE — Progress Notes (Signed)
Assisted Dr. Foster with right, ultrasound guided, femoral block. Side rails up, monitors on throughout procedure. See vital signs in flow sheet. Tolerated Procedure well. °

## 2016-07-30 NOTE — Anesthesia Procedure Notes (Signed)
Procedure Name: LMA Insertion Performed by: Bethani Brugger W Pre-anesthesia Checklist: Patient identified, Emergency Drugs available, Suction available and Patient being monitored Patient Re-evaluated:Patient Re-evaluated prior to inductionOxygen Delivery Method: Circle system utilized Preoxygenation: Pre-oxygenation with 100% oxygen Intubation Type: IV induction Ventilation: Mask ventilation without difficulty LMA: LMA inserted LMA Size: 5.0 Number of attempts: 1 Placement Confirmation: positive ETCO2 Tube secured with: Tape Dental Injury: Teeth and Oropharynx as per pre-operative assessment        

## 2016-08-02 NOTE — Op Note (Signed)
[name], [name]                    ACCOUNT NO.:  [id number]  MEDICAL RECORD NO.:  [id number]  LOCATION:                                 FACILITY:  PHYSICIAN:  Dyke BrackettW. D. Aahana Elza, M.D.         DATE OF BIRTH:  DATE OF PROCEDURE:  07/30/2016 DATE OF DISCHARGE:                              OPERATIVE REPORT   PREOPERATIVE DIAGNOSIS:  Torn anterior cruciate ligament of right knee.  POSTOPERATIVE DIAGNOSIS:  Torn anterior cruciate ligament of right knee.  OPERATION:  Bone-tendon-bone autograft reconstruction, right knee with Arthrex metal screws (8 x 20 on femur and 8 x 25 on tibia).  SURGEON:  Dyke BrackettW. D. Sinjin Amero, M.D.  ASSISTANT:  Margart SicklesJoshua Chadwell, PA-C.  TOURNIQUET:  Not used.  DESCRIPTION OF PROCEDURE:  4+ Lachman's, pivot shift.  Arthroscope to inferomedial and inferolateral portal.  Intra-articular exam showed no damage to the knee other than a complete interstitial tear of the ACL physically menisci, chondral surfaces normal.  Bone-tendon-bone autograft approximately 100 mm in length was harvested on the back table.  We then did a notchplasty as well.  We then placed a guide pin into the anatomic attachment site of the ACL followed by over reaming to 10 mm.  We then placed a guide pin trans-tibially in the 11 o'clock position for the right knee over reamed to a depth of approximately 25 mm.  Graft was next passed from tibial to femoral side with placement of a metal screw on the femoral side as well as on the tibial side of the above-mentioned sizes.  Full extension was noted without any impingement.  The graft was under good tension and the drawer was negligible.  We previously harvested some bone from the tibial site after bone-tendon-bone autograft was harvested through a midline incision.  We placed bone graft on the patellar side, oversewed the patellar defect with 0 Vicryl and the subcutaneous tissues with 2-0 Vicryl and the skin with Monocryl.  Lightly compressive sterile  dressing and knee immobilizer applied.  Taken to recovery room in stable condition.     Dyke BrackettW. D. Mousa Prout, M.D.   ______________________________ Dyke BrackettW. D. Tolbert Matheson, M.D.   WDC/MEDQ  D:  07/30/2016  T:  07/30/2016  Job:  [id number]
